# Patient Record
Sex: Female | Born: 1995 | Race: Black or African American | Hispanic: No | Marital: Single | Smoking: Never smoker
Health system: Southern US, Community
[De-identification: ages and names within clinical notes are randomized; demographics above are authoritative.]

---

## 2013-03-07 ENCOUNTER — Emergency Department (HOSPITAL_COMMUNITY)
Admission: EM | Admit: 2013-03-07 | Discharge: 2013-03-07 | Disposition: A | Discharge status: Home / Self Care | Payer: Medicaid Other | Attending: Emergency Medicine | Admitting: Emergency Medicine

## 2013-03-07 ENCOUNTER — Encounter (HOSPITAL_COMMUNITY): Payer: Self-pay | Admitting: Emergency Medicine

## 2013-03-07 DIAGNOSIS — K1370 Unspecified lesions of oral mucosa: Secondary | ICD-10-CM | POA: Insufficient documentation

## 2013-03-07 DIAGNOSIS — Z79899 Other long term (current) drug therapy: Secondary | ICD-10-CM | POA: Insufficient documentation

## 2013-03-07 DIAGNOSIS — K029 Dental caries, unspecified: Secondary | ICD-10-CM | POA: Insufficient documentation

## 2013-03-07 MED ORDER — ACETAMINOPHEN 500 MG PO TABS: 500.0000 mg | ORAL_TABLET | Freq: Four times a day (QID) | ORAL | Status: DC | PRN

## 2013-03-07 NOTE — ED Provider Notes (Signed)
History     CSN: [id number]  Arrival date & time 03/07/13  1154   First MD Initiated Contact with Patient 03/07/13 1306      Chief Complaint  Patient presents with  . Dental Pain    (Consider location/radiation/quality/duration/timing/severity/associated sxs/prior treatment) HPI Pt is a 17yo female c/o dental pain after chewing some gum today.  Pain is sharp and located on right upper gumline, worse when something touches it like food.  Pt states it felt like her tooth was "hanging out" after chewing gum, then she noticed a small amount of blood. Has not tried anything for pain.   Reports she needs to see a dentist for several other dental problems but needs a referral. Denies previous trauma to mouth before today.  Denies fever, n/v/d.  Denies trouble breathing or swallowing.  Denies sore throat.    History reviewed. No pertinent past medical history.  History reviewed. No pertinent past surgical history.  History reviewed. No pertinent family history.  History  Substance Use Topics  . Smoking status: Never Smoker   . Smokeless tobacco: Not on file  . Alcohol Use: No    OB History   Grav Para Term Preterm Abortions TAB SAB Ect Mult Living                  Review of Systems  Constitutional: Negative for fever and chills.  HENT: Positive for dental problem. Negative for sore throat, trouble swallowing, neck stiffness and voice change.   All other systems reviewed and are negative.    Allergies  Review of patient's allergies indicates no known allergies.  Home Medications   Current Outpatient Rx  Name  Route  Sig  Dispense  Refill  . norgestimate-ethinyl estradiol (MONONESSA) 0.25-35 MG-MCG tablet   Oral   Take 1 tablet by mouth daily.         Marland Kitchen acetaminophen (TYLENOL) 500 MG tablet   Oral   Take 1 tablet (500 mg total) by mouth every 6 (six) hours as needed for pain.   30 tablet   0     BP 116/67  Pulse 91  Temp(Src) 98.6 F (37 C) (Oral)  Resp 19   SpO2 100%  LMP 02/28/2013  Physical Exam  Nursing note and vitals reviewed. Constitutional: She appears well-developed and well-nourished. No distress.  HENT:  Head: Normocephalic and atraumatic. No trismus in the jaw.  Mouth/Throat: Uvula is midline, oropharynx is clear and moist and mucous membranes are normal. She does not have dentures. Oral lesions present. Abnormal dentition. Dental caries present. No dental abscesses, edematous or lacerations. No oropharyngeal exudate, posterior oropharyngeal edema, posterior oropharyngeal erythema or tonsillar abscesses.    Oral lesion in upper right gingiva between back two molars.  Tender, purple in color.  No drainage of pus or blood at this time.   Eyes: Conjunctivae are normal. No scleral icterus.  Neck: Normal range of motion. Neck supple. No JVD present. No tracheal deviation present. No thyromegaly present.  Cardiovascular: Normal rate, regular rhythm and normal heart sounds.   Pulmonary/Chest: Effort normal and breath sounds normal. No stridor. No respiratory distress. She has no wheezes. She has no rales. She exhibits no tenderness.  Musculoskeletal: Normal range of motion.  Lymphadenopathy:    She has no cervical adenopathy.  Neurological: She is alert.  Skin: Skin is warm and dry. She is not diaphoretic.  Psychiatric: She has a normal mood and affect. Her behavior is normal.    ED Course  Procedures (including critical care time)  Labs Reviewed - No data to display No results found.   1. Pain due to dental caries   2. Oral lesion       MDM  Pt c/o dental pain after chewing gum today.  States she notices some bleeding associated with the pain. Denies previous trauma to her mouth but know she needs to f/u with a dentist for multiple dental issues.  Denies fever, n/v/d, difficulty swallowing or breathing.  Denies drainage of pus.  Has not had anything for pain.  On exam there is a lesion on upper back right gum line where molar  should be.  Appears part of molar is missing.  Lesion is TTP but no drainage of pus or blood.    Pt also has various other dental caries. Do not suspect abscess or infection at this time.  Not concerned for peritonsillar abscess.  Will refer to Dr. Oswaldo Done, DDS, for further evaluation and treatment.  Rx: ibuprofen.  Advised pt to call within 24-48hrs for appointment.  Advised against eating hard food until evaluated by dentist.   Return to ED if difficulty swallowing or breathing.         Junius Finner, PA-C 03/07/13 [phone]

## 2013-03-07 NOTE — ED Notes (Signed)
States that she was in school when her right upper molar began to bleed. Tooth appears to be damaged. States that she has not seen a dentist in > 3 yrs

## 2013-03-07 NOTE — ED Notes (Signed)
Pt states that she was chewing gum and one of her molars is now "hanging out".

## 2013-03-07 NOTE — ED Notes (Signed)
Voiced understanding of instructions given 

## 2013-03-07 NOTE — ED Provider Notes (Signed)
Medical screening examination/treatment/procedure(s) were performed by non-physician practitioner and as supervising physician I was immediately available for consultation/collaboration.    Jamone Garrido R Vannessa Godown, MD 03/07/13 1606 

## 2013-03-22 ENCOUNTER — Telehealth (HOSPITAL_COMMUNITY): Payer: Self-pay | Admitting: Emergency Medicine

## 2013-05-29 ENCOUNTER — Emergency Department (HOSPITAL_COMMUNITY)
Admission: EM | Admit: 2013-05-29 | Discharge: 2013-05-29 | Disposition: A | Discharge status: Home / Self Care | Payer: Medicaid Other | Attending: Emergency Medicine | Admitting: Emergency Medicine

## 2013-05-29 ENCOUNTER — Encounter (HOSPITAL_COMMUNITY): Payer: Self-pay | Admitting: Emergency Medicine

## 2013-05-29 DIAGNOSIS — N760 Acute vaginitis: Secondary | ICD-10-CM | POA: Insufficient documentation

## 2013-05-29 DIAGNOSIS — N898 Other specified noninflammatory disorders of vagina: Secondary | ICD-10-CM | POA: Insufficient documentation

## 2013-05-29 DIAGNOSIS — B379 Candidiasis, unspecified: Secondary | ICD-10-CM | POA: Insufficient documentation

## 2013-05-29 DIAGNOSIS — Z3202 Encounter for pregnancy test, result negative: Secondary | ICD-10-CM | POA: Insufficient documentation

## 2013-05-29 DIAGNOSIS — B373 Candidiasis of vulva and vagina: Secondary | ICD-10-CM

## 2013-05-29 LAB — URINALYSIS, ROUTINE W REFLEX MICROSCOPIC
Ketones, ur: NEGATIVE mg/dL
Nitrite: NEGATIVE
Specific Gravity, Urine: 1.025 (ref 1.005–1.030)
Urobilinogen, UA: 1 mg/dL (ref 0.0–1.0)

## 2013-05-29 LAB — URINE MICROSCOPIC-ADD ON

## 2013-05-29 LAB — WET PREP, GENITAL
Clue Cells Wet Prep HPF POC: NONE SEEN
Trich, Wet Prep: NONE SEEN

## 2013-05-29 MED ORDER — FLUCONAZOLE 150 MG PO TABS: 150.0000 mg | ORAL_TABLET | Freq: Once | ORAL | Status: DC

## 2013-05-29 MED ORDER — FLUCONAZOLE 100 MG PO TABS
150.0000 mg | ORAL_TABLET | Freq: Once | ORAL | Status: AC
  Administered 2013-05-29: 150 mg via ORAL
  Filled 2013-05-29: qty 2

## 2013-05-29 NOTE — ED Notes (Signed)
Pt c/o vaginal itching and burning x several days; pt sts LMP was last week; pt sts hx of yeast infection and feels same

## 2013-05-29 NOTE — ED Provider Notes (Signed)
History    CSN: [id number] Arrival date & time 05/29/13  1238  First MD Initiated Contact with Patient 05/29/13 1246     Chief Complaint  Patient presents with  . Vaginal Itching   (Consider location/radiation/quality/duration/timing/severity/associated sxs/prior Treatment) HPI Comments: Pt w/ no PMHx now w/ vaginal irritation. Took abx for tooth abscess, noted several days of vaginal irritation and itching - exacerbated w/ wiping and showering. A/w vaginal discharge. LMP several days ago, + sex w/out protection - uses BCP. Denies hematuria/polyuria/dysuria. No fever, abd pain or emesis. No vaginal bleeding.   Patient is a 17 y.o. female presenting with general illness. The history is provided by the patient. No language interpreter was used.  Illness Location:  GU Quality:  Vaginal irritation, itching Severity:  Mild Onset quality:  Gradual Timing:  Constant Progression:  Worsening Chronicity:  New Associated symptoms: no abdominal pain, no chest pain, no congestion, no cough, no diarrhea, no fever, no headaches, no nausea, no rash, no shortness of breath, no sore throat and no vomiting    History reviewed. No pertinent past medical history. History reviewed. No pertinent past surgical history. History reviewed. No pertinent family history. History  Substance Use Topics  . Smoking status: Never Smoker   . Smokeless tobacco: Not on file  . Alcohol Use: No   OB History   Grav Para Term Preterm Abortions TAB SAB Ect Mult Living                 Review of Systems  Constitutional: Negative for fever and chills.  HENT: Negative for congestion and sore throat.   Respiratory: Negative for cough and shortness of breath.   Cardiovascular: Negative for chest pain and leg swelling.  Gastrointestinal: Negative for nausea, vomiting, abdominal pain, diarrhea and constipation.  Genitourinary: Positive for vaginal discharge and vaginal pain. Negative for dysuria and frequency.  Skin:  Negative for color change and rash.  Neurological: Negative for dizziness and headaches.  Psychiatric/Behavioral: Negative for confusion and agitation.  All other systems reviewed and are negative.    Allergies  Review of patient's allergies indicates no known allergies.  Home Medications   Current Outpatient Rx  Name  Route  Sig  Dispense  Refill  . acetaminophen (TYLENOL) 500 MG tablet   Oral   Take 1 tablet (500 mg total) by mouth every 6 (six) hours as needed for pain.   30 tablet   0   . norgestimate-ethinyl estradiol (MONONESSA) 0.25-35 MG-MCG tablet   Oral   Take 1 tablet by mouth daily.          BP 129/78  Pulse 98  Temp(Src) 98 F (36.7 C) (Oral)  Resp 16  SpO2 100% Physical Exam  Vitals reviewed. Constitutional: She is oriented to person, place, and time. She appears well-developed and well-nourished. No distress.  HENT:  Head: Normocephalic and atraumatic.  Eyes: EOM are normal. Pupils are equal, round, and reactive to light.  Neck: Normal range of motion. Neck supple.  Cardiovascular: Normal rate and regular rhythm.   Pulmonary/Chest: Effort normal. No respiratory distress.  Abdominal: Soft. She exhibits no distension. There is no tenderness.  Genitourinary: Vagina normal. Uterus is not tender. Cervix exhibits discharge (white). Cervix exhibits no motion tenderness and no friability. Right adnexum displays no mass and no tenderness. Left adnexum displays no mass and no tenderness.  Musculoskeletal: Normal range of motion. She exhibits no edema.  Neurological: She is alert and oriented to person, place, and time.  Skin:  Skin is warm and dry.  Psychiatric: She has a normal mood and affect. Her behavior is normal.    ED Course  Procedures (including critical care time) Labs Reviewed  WET PREP, GENITAL  GC/CHLAMYDIA PROBE AMP  URINALYSIS, ROUTINE W REFLEX MICROSCOPIC   Results for orders placed during the hospital encounter of 05/29/13  WET PREP,  GENITAL      Result Value Range   Yeast Wet Prep HPF POC MODERATE (*) NONE SEEN   Trich, Wet Prep NONE SEEN  NONE SEEN   Clue Cells Wet Prep HPF POC NONE SEEN  NONE SEEN   WBC, Wet Prep HPF POC FEW (*) NONE SEEN  URINALYSIS, ROUTINE W REFLEX MICROSCOPIC      Result Value Range   Color, Urine YELLOW  YELLOW   APPearance CLEAR  CLEAR   Specific Gravity, Urine 1.025  1.005 - 1.030   pH 5.5  5.0 - 8.0   Glucose, UA NEGATIVE  NEGATIVE mg/dL   Hgb urine dipstick NEGATIVE  NEGATIVE   Bilirubin Urine SMALL (*) NEGATIVE   Ketones, ur NEGATIVE  NEGATIVE mg/dL   Protein, ur NEGATIVE  NEGATIVE mg/dL   Urobilinogen, UA 1.0  0.0 - 1.0 mg/dL   Nitrite NEGATIVE  NEGATIVE   Leukocytes, UA SMALL (*) NEGATIVE  URINE MICROSCOPIC-ADD ON      Result Value Range   Squamous Epithelial / LPF FEW (*) RARE   WBC, UA 0-2  <3 WBC/hpf   RBC / HPF 0-2  <3 RBC/hpf   Bacteria, UA RARE  RARE   Urine-Other MUCOUS PRESENT    POCT PREGNANCY, URINE      Result Value Range   Preg Test, Ur NEGATIVE  NEGATIVE    No results found. No diagnosis found.  MDM  Exam as above, nad, no CMT, copious white discharge - wet prep + for yeast. U/a and hcg negative. At this time doubt PID/cervicitis. Doubt TOA. Given diflucan in ED. Stable for d/c home. Given return precautions. D/c in good condition.  I have personally reviewed labs and considered in my MDM. Case d/w Dr Hyacinth Meeker  1. Yeast infection of the vagina    Discharge Medication List as of 05/29/2013  3:04 PM    START taking these medications   Details  fluconazole (DIFLUCAN) 150 MG tablet Take 1 tablet (150 mg total) by mouth once., Starting 06/05/2013, Print       [facility]     [address] [place] [address]   As needed if symptoms worsen    Audelia Hives, MD 05/29/13 [phone]

## 2013-05-30 LAB — GC/CHLAMYDIA PROBE AMP
CT Probe RNA: NEGATIVE
GC Probe RNA: NEGATIVE

## 2013-05-30 NOTE — ED Provider Notes (Signed)
I saw and evaluated the patient, reviewed the resident's note and I agree with the findings and plan.   Vida Roller, MD 05/30/13 [phone]

## 2013-08-08 ENCOUNTER — Encounter (HOSPITAL_COMMUNITY): Payer: Self-pay | Admitting: Emergency Medicine

## 2013-08-08 ENCOUNTER — Emergency Department (HOSPITAL_COMMUNITY)
Admission: EM | Admit: 2013-08-08 | Discharge: 2013-08-08 | Disposition: A | Discharge status: Home / Self Care | Payer: Medicaid Other | Attending: Emergency Medicine | Admitting: Emergency Medicine

## 2013-08-08 DIAGNOSIS — Z79899 Other long term (current) drug therapy: Secondary | ICD-10-CM | POA: Insufficient documentation

## 2013-08-08 DIAGNOSIS — Z3202 Encounter for pregnancy test, result negative: Secondary | ICD-10-CM | POA: Insufficient documentation

## 2013-08-08 DIAGNOSIS — N390 Urinary tract infection, site not specified: Secondary | ICD-10-CM | POA: Insufficient documentation

## 2013-08-08 DIAGNOSIS — Z792 Long term (current) use of antibiotics: Secondary | ICD-10-CM | POA: Insufficient documentation

## 2013-08-08 LAB — URINALYSIS, ROUTINE W REFLEX MICROSCOPIC
Glucose, UA: NEGATIVE mg/dL
Nitrite: NEGATIVE
Specific Gravity, Urine: 1.029 (ref 1.005–1.030)
pH: 6 (ref 5.0–8.0)

## 2013-08-08 LAB — URINE MICROSCOPIC-ADD ON

## 2013-08-08 LAB — POCT PREGNANCY, URINE: Preg Test, Ur: NEGATIVE

## 2013-08-08 MED ORDER — PHENAZOPYRIDINE HCL 200 MG PO TABS: 200.0000 mg | ORAL_TABLET | Freq: Three times a day (TID) | ORAL | Status: DC

## 2013-08-08 MED ORDER — CEPHALEXIN 500 MG PO CAPS: 500.0000 mg | ORAL_CAPSULE | Freq: Three times a day (TID) | ORAL | Status: DC

## 2013-08-08 NOTE — ED Provider Notes (Signed)
CSN: [id number]     Arrival date & time 08/08/13  0941 History   First MD Initiated Contact with Patient 08/08/13 1112     Chief Complaint  Patient presents with  . Urinary Tract Infection   (Consider location/radiation/quality/duration/timing/severity/associated sxs/prior Treatment) HPI 17 year old female presents emergency department with chief complaint of urinary tract infection.  Patient has had 2 weeks of intermittent dysuria, hematuria and frequency of urination.  She's never had a urinary tract infection before however her mother told her that her symptoms seemed consistent with this diagnosis.  The patient did take antibiotics for dental work that she had done and her symptoms seemed to resolve however this morning she awoke with recurrent urinary discomfort and decided to come for evaluation.  Patient denies fevers, chills, myalgias for flank pain or suprapubic pain.  Patient denies any vaginal symptoms.  History reviewed. No pertinent past medical history. History reviewed. No pertinent past surgical history. No family history on file. History  Substance Use Topics  . Smoking status: Never Smoker   . Smokeless tobacco: Not on file  . Alcohol Use: No   OB History   Grav Para Term Preterm Abortions TAB SAB Ect Mult Living                 Review of Systems Ten systems reviewed and are negative for acute change, except as noted in the HPI.   Allergies  Review of patient's allergies indicates no known allergies.  Home Medications   Current Outpatient Rx  Name  Route  Sig  Dispense  Refill  . amoxicillin (AMOXIL) 500 MG tablet   Oral   Take 500 mg by mouth 2 (two) times daily.         Marland Kitchen ibuprofen (ADVIL,MOTRIN) 200 MG tablet   Oral   Take 200 mg by mouth every 6 (six) hours as needed for pain or headache.         . norgestimate-ethinyl estradiol (MONONESSA) 0.25-35 MG-MCG tablet   Oral   Take 1 tablet by mouth daily.          BP 119/61  Pulse 78  Temp(Src)  98.2 F (36.8 C)  Resp 16  SpO2 98% Physical Exam Physical Exam  Nursing note and vitals reviewed. Constitutional: She is oriented to person, place, and time. She appears well-developed and well-nourished. No distress.  HENT:  Head: Normocephalic and atraumatic.  Eyes: Conjunctivae normal and EOM are normal. Pupils are equal, round, and reactive to light. No scleral icterus.  Neck: Normal range of motion.  Cardiovascular: Normal rate, regular rhythm and normal heart sounds.  Exam reveals no gallop and no friction rub.   No murmur heard. Pulmonary/Chest: Effort normal and breath sounds normal. No respiratory distress.  Abdominal: Soft. Bowel sounds are normal. She exhibits no distension and no mass. There is no tenderness. There is no guarding.  Neurological: She is alert and oriented to person, place, and time.  Skin: Skin is warm and dry. She is not diaphoretic.    ED Course  Procedures (including critical care time) Labs Review Labs Reviewed  URINALYSIS, ROUTINE W REFLEX MICROSCOPIC - Abnormal; Notable for the following:    APPearance CLOUDY (*)    Urobilinogen, UA 2.0 (*)    Leukocytes, UA MODERATE (*)    All other components within normal limits  URINE MICROSCOPIC-ADD ON  POCT PREGNANCY, URINE   Imaging Review No results found.  MDM   1. UTI (lower urinary tract infection)    Pt  has been diagnosed with a UTI. Pt is afebrile, no CVA tenderness, normotensive, and denies N/V. Pt to be dc home with antibiotics and instructions to follow up with PCP if symptoms persist.     Arthor Captain, PA-C 08/08/13 1226

## 2013-08-08 NOTE — ED Notes (Signed)
Has had s/s of UTI x 1 week  Took some old antiobiotics  And it went away now back hurts to void denies vag d/c

## 2013-08-08 NOTE — ED Notes (Signed)
Pt discharged home with all belongings, pt alert and ambulatory upon discharge, pt received 2 new RX verbalizes understanding of d/c instructions.

## 2013-08-08 NOTE — ED Notes (Signed)
Pt reports burning with urination, vaginal odor, and blood in her urine x1 week, took some old antibiotics which she stopped when she realized it was the wrong type of antibiotic, pt reports her symptoms started back today; burning with urination and a mild vaginal odor. Pt denies blood in her urine or vaginal discharge.

## 2013-08-12 NOTE — ED Provider Notes (Signed)
Medical screening examination/treatment/procedure(s) were performed by non-physician practitioner and as supervising physician I was immediately available for consultation/collaboration.  Raeford Razor, MD 08/12/13 1209

## 2014-05-01 ENCOUNTER — Emergency Department (HOSPITAL_COMMUNITY)
Admission: EM | Admit: 2014-05-01 | Discharge: 2014-05-01 | Disposition: A | Discharge status: Home / Self Care | Payer: Medicaid Other | Attending: Emergency Medicine | Admitting: Emergency Medicine

## 2014-05-01 ENCOUNTER — Encounter (HOSPITAL_COMMUNITY): Payer: Self-pay | Admitting: Emergency Medicine

## 2014-05-01 DIAGNOSIS — Z3202 Encounter for pregnancy test, result negative: Secondary | ICD-10-CM | POA: Insufficient documentation

## 2014-05-01 DIAGNOSIS — R319 Hematuria, unspecified: Secondary | ICD-10-CM

## 2014-05-01 DIAGNOSIS — Z79899 Other long term (current) drug therapy: Secondary | ICD-10-CM | POA: Insufficient documentation

## 2014-05-01 DIAGNOSIS — IMO0002 Reserved for concepts with insufficient information to code with codable children: Secondary | ICD-10-CM | POA: Insufficient documentation

## 2014-05-01 DIAGNOSIS — Z792 Long term (current) use of antibiotics: Secondary | ICD-10-CM | POA: Insufficient documentation

## 2014-05-01 DIAGNOSIS — N390 Urinary tract infection, site not specified: Secondary | ICD-10-CM

## 2014-05-01 LAB — URINALYSIS, ROUTINE W REFLEX MICROSCOPIC
Bilirubin Urine: NEGATIVE
Glucose, UA: NEGATIVE mg/dL
KETONES UR: NEGATIVE mg/dL
Nitrite: NEGATIVE
PROTEIN: NEGATIVE mg/dL
Specific Gravity, Urine: 1.021 (ref 1.005–1.030)
UROBILINOGEN UA: 1 mg/dL (ref 0.0–1.0)
pH: 7.5 (ref 5.0–8.0)

## 2014-05-01 LAB — POC URINE PREG, ED: Preg Test, Ur: NEGATIVE

## 2014-05-01 LAB — URINE MICROSCOPIC-ADD ON

## 2014-05-01 MED ORDER — CEPHALEXIN 500 MG PO CAPS: 500.0000 mg | ORAL_CAPSULE | Freq: Three times a day (TID) | ORAL | Status: DC

## 2014-05-01 MED ORDER — SULFAMETHOXAZOLE-TRIMETHOPRIM 800-160 MG PO TABS: 1.0000 | ORAL_TABLET | Freq: Two times a day (BID) | ORAL | Status: DC

## 2014-05-01 NOTE — ED Notes (Signed)
Declined W/C at D/C and was escorted to lobby by RN. 

## 2014-05-01 NOTE — ED Notes (Addendum)
Pt from home with reports of frequent urination x1 week, pt also reports a odor whenever she uses the restroom. Denies any vaginal discharge, fever, n/v/d. Pt states hx of same symptoms and was given antibiotics that helped. nad noted. Axo x4.

## 2014-05-01 NOTE — Discharge Instructions (Signed)
Please call your doctor for a followup appointment within 24-48 hours. When you talk to your doctor please let them know that you were seen in the emergency department and have them acquire all of your records so that they can discuss the findings with you and formulate a treatment plan to fully care for your new and ongoing problems. Please call and set-up an appointment with your primary care provider to be seen and assessed Please rest and stay hydrated - please drink plenty of water Please take medications as prescribed and on a full stomach  Please continue to monitor symptoms and if symptoms are to worsen or change (fever greater than 101, chills, sweating, nausea, vomiting, chest pain, shortness of breath, difficulty breathing, stomach pain, blood in the stools, black tarry stools) please report back to the ED immediately  Urinary Tract Infection Urinary tract infections (UTIs) can develop anywhere along your urinary tract. Your urinary tract is your body's drainage system for removing wastes and extra water. Your urinary tract includes two kidneys, two ureters, a bladder, and a urethra. Your kidneys are a pair of bean-shaped organs. Each kidney is about the size of your fist. They are located below your ribs, one on each side of your spine. CAUSES Infections are caused by microbes, which are microscopic organisms, including fungi, viruses, and bacteria. These organisms are so small that they can only be seen through a microscope. Bacteria are the microbes that most commonly cause UTIs. SYMPTOMS  Symptoms of UTIs may vary by age and gender of the patient and by the location of the infection. Symptoms in young women typically include a frequent and intense urge to urinate and a painful, burning feeling in the bladder or urethra during urination. Older women and men are more likely to be tired, shaky, and weak and have muscle aches and abdominal pain. A fever may mean the infection is in your  kidneys. Other symptoms of a kidney infection include pain in your back or sides below the ribs, nausea, and vomiting. DIAGNOSIS To diagnose a UTI, your caregiver will ask you about your symptoms. Your caregiver also will ask to provide a urine sample. The urine sample will be tested for bacteria and white blood cells. White blood cells are made by your body to help fight infection. TREATMENT  Typically, UTIs can be treated with medication. Because most UTIs are caused by a bacterial infection, they usually can be treated with the use of antibiotics. The choice of antibiotic and length of treatment depend on your symptoms and the type of bacteria causing your infection. HOME CARE INSTRUCTIONS  If you were prescribed antibiotics, take them exactly as your caregiver instructs you. Finish the medication even if you feel better after you have only taken some of the medication.  Drink enough water and fluids to keep your urine clear or pale yellow.  Avoid caffeine, tea, and carbonated beverages. They tend to irritate your bladder.  Empty your bladder often. Avoid holding urine for long periods of time.  Empty your bladder before and after sexual intercourse.  After a bowel movement, women should cleanse from front to back. Use each tissue only once. SEEK MEDICAL CARE IF:   You have back pain.  You develop a fever.  Your symptoms do not begin to resolve within 3 days. SEEK IMMEDIATE MEDICAL CARE IF:   You have severe back pain or lower abdominal pain.  You develop chills.  You have nausea or vomiting.  You have continued burning  or discomfort with urination. MAKE SURE YOU:   Understand these instructions.  Will watch your condition.  Will get help right away if you are not doing well or get worse. Document Released: 08/04/2005 Document Revised: 04/25/2012 Document Reviewed: 12/03/2011 [facility] Patient Information 2015 [place], [place]. This information is not intended to replace  advice given to you by your health care provider. Make sure you discuss any questions you have with your health care provider.   Emergency Department Resource Guide 1) Find a Doctor and Pay Out of Pocket Although you won't have to find out who is covered by your insurance plan, it is a good idea to ask around and get recommendations. You will then need to call the office and see if the doctor you have chosen will accept you as a new patient and what types of options they offer for patients who are self-pay. Some doctors offer discounts or will set up payment plans for their patients who do not have insurance, but you will need to ask so you aren't surprised when you get to your appointment.  2) Contact Your Local Health Department Not all health departments have doctors that can see patients for sick visits, but many do, so it is worth a call to see if yours does. If you don't know where your local health department is, you can check in your phone book. The CDC also has a tool to help you locate your state's health department, and many state websites also have listings of all of their local health departments.  3) Find a Walk-in Clinic If your illness is not likely to be very severe or complicated, you may want to try a walk in clinic. These are popping up all over the country in pharmacies, drugstores, and shopping centers. They're usually staffed by nurse practitioners or physician assistants that have been trained to treat common illnesses and complaints. They're usually fairly quick and inexpensive. However, if you have serious medical issues or chronic medical problems, these are probably not your best option.  No Primary Care Doctor: - Call Health Connect at  [phone] - they can help you locate a primary care doctor that  accepts your insurance, provides certain services, etc. - Physician Referral Service- [phone]  Chronic Pain Problems: Organization         Address  Phone    Notes  [facility]  [phone] Patients need to be referred by their primary care doctor.   Medication Assistance: Organization         Address  Phone   Notes  [facility] Medication [facility] [address] [place], [place] [address] [phone] --Must be a resident of [facility] -- Must have NO insurance coverage whatsoever (no Medicaid/ Medicare, etc.) -- The pt. MUST have a primary care doctor that directs their care regularly and follows them in the community   [facility]  [phone]   [employer]  [phone]    Agencies that provide inexpensive medical care: Organization         Address  Phone   Notes  [facility]  [phone]   [facility]    [phone]   [facility] [address] [place], [place] [address] [phone]   [facility] [address], [place] [phone]   Planned Parenthood    [phone]   [facility]    [phone])  [phone]   [facility]  [address], [place] Phone:  [phone](336) [phone], Fax:  [phone](336) [phone] Hours of Operation:  9 am - 6 pm, M-F.  Also accepts Medicaid/Medicare and self-pay.  [facility]  [address], [place] Phone: [phone](336) [phone], Fax: [phone]. Hours of Operation:  8:30 am - 5:30 pm, M-F.  Also accepts Medicaid and self-pay.  [facility] [place] [address], [place] Phone: [phone]   [facility] [address], [place], [place] [phone], Ext. [phone] Mondays & Thursdays: 7-9 AM.  First 15 patients are seen on a first come, first serve basis.    Medicaid-accepting [facility] Providers:  Organization         Address  Phone   Notes  [facility] [address], [place] [phone] Also accepts self-pay patients.  [facility]  [address], [place]  [phone](336) [phone]   [facility] [address], [place] [phone]) [phone]   [facility] [address] [place] [address], [place] [phone]) [phone]   Renaye RakersVeita Bland [address], [place]   [phone] Only accepts [place] Access [place] patients after they have their name applied to their card.   Self-Pay (no insurance) in [facility]:  Organization         Address  Phone   Notes  Sickle Cell Patients, [facility] [address], [place] [phone]   [facility] [address], [place] [phone]) [phone]   [facility] [place]  [address], [place] [phone]   [facility]/Dr. Osei-Bonsu  [address] [place] [address], [place] or [phone], [place] [phone] Phone number for both [place] and [place] locations is the same.  [facility] [address], [place] [phone](336) [phone]   [facility] [place] [address] [place] [address], [place] or [address] [phone]) [phone]   [facility] [address], [place] [phone]) [phone], phone; [phone]) [phone], fax Sees patients 1st and 3rd Saturday of every month.  Must not qualify for public or private insurance (i.e. Medicaid, Medicare, [place] Health Choice, Veterans' Benefits)  Household income should be no more than 200% of the poverty level The clinic cannot treat you if you are pregnant or think you are pregnant  Sexually transmitted diseases are not treated at the clinic.    Dental Care: Organization         Address  Phone  Notes  [facility] [address], [place] [phone] Accepts children up to age 20 who are enrolled in [place] or [place] Health Choice; pregnant women with a Medicaid card; and children who have  applied for Medicaid or [place] Health Choice, but were declined, whose parents can pay a reduced fee at time of service.  [facility] [place]  [address], [place] [phone] Accepts children up to age 20 who are enrolled in [place] or [place] Health Choice; pregnant women with a Medicaid card; and children who have applied for Medicaid or [place] Health Choice, but were declined, whose parents can pay a reduced fee at time of service.  [facility]  Access PROGRAM ° [address], [place] [phone] Patients are seen by appointment only. Walk-ins are not accepted. [facility] will see patients 18 years of age and older. °Monday - Tuesday (8am-5pm) °Most Wednesdays (8:30-5pm) °$30 per visit, cash only  °[facility] ° [address], [place] [phone] Patients are seen by appointment only. Walk-ins are not accepted. [facility] will see patients 18 years of age and older. °One Wednesday Evening (Monthly: Volunteer Based).  $30 per visit, cash only  °[facility]  [phone] for adults; Children under age 4, call Graduate Pediatric Dentistry at [phone]. Children aged 4-14, please call [phone] to request a pediatric application. ° Dental services are provided in all areas of dental care including fillings, crowns and bridges, complete and partial dentures, implants, gum treatment, root canals, and extractions. Preventive care is also provided. Treatment is provided to both adults and children. °Patients are selected via a lottery and there is often a waiting list. °  °[facility] [address], °[place] ° [phone] [email or web address] °  °[facility] [address], [place], [place] [phone], Ext. [phone] Second and Fourth Thursday of each month, opens at 6:30 AM; Clinic ends at 9 AM.  Patients are seen on a first-come first-served basis, and a  limited number are seen during each clinic.  ° °[facility] ° [address], [place], [place] [phone]   Eligibility Requirements °You must have lived in [place], [place], or [place] counties for at least the last three months. °  You cannot be eligible for state or federal sponsored healthcare insurance, including Veterans Administration, Medicaid, or Medicare. °  You generally cannot be eligible for healthcare insurance through your employer.  °  How to apply: °Eligibility screenings are held every Tuesday and Wednesday afternoon from 1:00 pm until 4:00 pm. You do not need an appointment for the interview!  °[facility] [address], [place], [place] [phone]   °[facility]  [phone]   °[facility]  [phone]   °[facility]  336-570-6415   ° °Behavioral Health Resources in the Community: °Intensive Outpatient Programs °Organization         Address  Phone  Notes  °[facility] [address], [place], [place] [phone]   °[facility] [address], [place], [place] [phone]   °[facility] [address], [place], [place] ° 336-882-2125   °[facility] [address],  °[place],  1-800-853-5163 or 336-641-4981   °Substance Abuse Resources °Organization         Address  Phone  Notes  °[facility]  336-882-2125   °[facility]  336-784-9470   °[facility]  336-285-9073   °[facility]  336-845-3988   °Residential & Outpatient Substance Abuse Program  1-800-659-3381   °Psychological Services °Organization         Address  Phone  Notes  °[facility]  336- 832-9600   °[facility]  336- 378-7881   °[facility] [address], [place] 1-800-853-5163 or 336-641-4981   ° °Mobile Crisis Teams °Organization          Address  Phone  Notes  °[facility], Mobile Crisis Care Unit  1-877-626-1772   °Assertive °Psychotherapeutic Services ° [address] [place],   208-238-1719   [facility] [address] [place] [phone]    Self-Help/Support Groups Organization         Address  Phone             Notes  [facility] [place] - variety of support groups  336- I7437963 Call for more information  Narcotics Anonymous (NA), [facility] [address], [employer] [place]  2 meetings at this location   Statistician         Address  Phone  Notes  [facility] [address],    [place]  2-956-213-0865   [facility]  [address], [place] [address], [place], [place] [phone]   [facility] [address] [place], [place] [phone] Admissions: 8am-3pm M-F  [facility] [address],    [place], [place] [phone]   [facility] [address] [place], [place], [place] [phone]   [facility] [address]  [place], [place] [phone]   [facility] Intensive Outpatient [address], [place], [place] [phone]   [facility] ([facility] [address] [place]  [place], [place] 9-323-557-3220 or 803-625-2913   [facility] ([facility]) [address] [place], [place] 628-315-1761 Accepts Medicaid  [facility] [address]  [place] 6-073-710-6269 Substance Abuse/Addiction Treatment   [facility] Organization         Address  Phone  Notes  [facility]  815-408-7458   Angie Fava, PhD [address] [place], [place]   223-653-1939 or [phone]   [facility]   [address] [place], [place] [phone]   [facility] [address], [place], [place] [phone] Insurance/Medicaid/sponsorship  through [facility] and Families [address]                                    [place], [place] [phone])[phone] Therapy/tele-psych/case  [facility] [address], [place] [phone]    Dr. Lolly Mustache  [phone]   [facility]  [facility] The Dept. 1) [address], [place] 2) [address] [place] 3)  [address], [place] [phone]  9866934820   [facility] Child Abuse Hotline 831 041 2971 or 4782608921 (After Hours)

## 2014-05-01 NOTE — ED Provider Notes (Signed)
CSN: [id number]     Arrival date & time 05/01/14  1823 History  This chart was scribed for Candace MuttonMarissa Sciacca, PA-C working with Candace GivensIva L Knapp, MD by Candace Savage, ED Scribe. This patient was seen in room TR11C/TR11C and the patient's care was started at 6:43 PM.    Chief Complaint  Patient presents with  . Dysuria   Patient is a 18 y.o. female presenting with dysuria. The history is provided by the patient. No language interpreter was used.  Dysuria Associated symptoms: no abdominal pain, no nausea, no vaginal discharge and no vomiting    HPI Comments: Candace Savage is a 18 y.o. female who presents to the Emergency Department complaining of dysuria onset 1 week prior. She states she has associated odor. She states dysuria isn't present when she drinks plenty of water. She states he has been drinking a lot of cranberry juice with some relief. She states she has taken penicillin x7 with no relief. She states she has had similar symptoms before that were relieved with antibiotics. She denies vaginal discharge, fever, nausea, vomiting, diarrhea, abdominal pain, back pain, neck pain, vaginal pain, hematuria, melena, hematochezia.   History reviewed. No pertinent past medical history. History reviewed. No pertinent past surgical history. No family history on file. History  Substance Use Topics  . Smoking status: Never Smoker   . Smokeless tobacco: Not on file  . Alcohol Use: No   OB History   Grav Para Term Preterm Abortions TAB SAB Ect Mult Living                 Review of Systems  Gastrointestinal: Negative for nausea, vomiting, abdominal pain, diarrhea and blood in stool.  Genitourinary: Positive for dysuria and frequency. Negative for hematuria, vaginal discharge and vaginal pain.  Musculoskeletal: Negative for back pain and neck pain.    Allergies  Review of patient's allergies indicates no known allergies.  Home Medications   Prior to Admission medications   Medication Sig  Start Date End Date Taking? Authorizing [name]  Acetaminophen (TYLENOL PO) Take 2 tablets by mouth daily as needed (for headache).   Yes Historical Karra Pink, MD  ibuprofen (ADVIL,MOTRIN) 200 MG tablet Take 400 mg by mouth every 6 (six) hours as needed for pain or headache.    Yes Historical Roldan Laforest, MD  norgestimate-ethinyl estradiol (MONONESSA) 0.25-35 MG-MCG tablet Take 1 tablet by mouth daily.   Yes Historical Marriah Sanderlin, MD  OVER THE COUNTER MEDICATION Apply 1 application topically daily as needed (for ingrown hair).   Yes Historical Sharia Averitt, MD  cephALEXin (KEFLEX) 500 MG capsule Take 1 capsule (500 mg total) by mouth 3 (three) times daily. 05/01/14   Candace Sciacca, PA-C  sulfamethoxazole-trimethoprim (BACTRIM DS,SEPTRA DS) 800-160 MG per tablet Take 1 tablet by mouth 2 (two) times daily. 05/01/14 05/08/14  Candace Sciacca, PA-C   Triage Vitals: BP 119/72  Pulse 84  Temp(Src) 98 F (36.7 C) (Oral)  Resp 16  Ht 5\' 2"  (1.575 m)  Wt 21 lb (9.526 kg)  BMI 3.84 kg/m2  SpO2 100%  LMP 04/22/2014  Physical Exam  Nursing note and vitals reviewed. Constitutional: She is oriented to person, place, and time. She appears well-developed and well-nourished. No distress.  HENT:  Head: Normocephalic and atraumatic.  Mouth/Throat: Oropharynx is clear and moist. No oropharyngeal exudate.  Eyes: Conjunctivae and EOM are normal. Pupils are equal, round, and reactive to light. Right eye exhibits no discharge. Left eye exhibits no discharge.  Neck: Normal range of motion. Neck  supple. No tracheal deviation present.  Negative neck stiffness Negative nuchal rigidity Next cervical lymphadenopathy Negative meningeal signs  Cardiovascular: Normal rate, regular rhythm and normal heart sounds.  Exam reveals no friction rub.   No murmur heard. Cap refill less than 3 seconds  Pulmonary/Chest: Effort normal and breath sounds normal. No respiratory distress. She has no wheezes. She has no rales.  Patient is  able to speak in full sentences without difficulty Negative use of accessory muscles Negative stridor  Abdominal: Soft. Bowel sounds are normal. She exhibits no distension. There is no tenderness. There is no rebound and no guarding.  Negative abdominal distention noted Bowel sounds normal active in all 4 quadrants Abdomen soft upon palpation Negative CVA tenderness bilaterally Negative guarding or rigidity Negative peritoneal signs  Musculoskeletal: Normal range of motion.  Full ROM to upper and lower extremities without difficulty noted, negative ataxia noted.  Lymphadenopathy:    She has no cervical adenopathy.  Neurological: She is alert and oriented to person, place, and time. No cranial nerve deficit. She exhibits normal muscle tone. Coordination normal.  Skin: Skin is warm and dry. She is not diaphoretic.  Psychiatric: She has a normal mood and affect. Her behavior is normal.    ED Course  Procedures (including critical care time) DIAGNOSTIC STUDIES: Oxygen Saturation is 100% on RA, normal by my interpretation.    COORDINATION OF CARE:  Results for orders placed during the hospital encounter of 05/01/14  URINALYSIS, ROUTINE W REFLEX MICROSCOPIC      Result Value Ref Range   Color, Urine YELLOW  YELLOW   APPearance CLOUDY (*) CLEAR   Specific Gravity, Urine 1.021  1.005 - 1.030   pH 7.5  5.0 - 8.0   Glucose, UA NEGATIVE  NEGATIVE mg/dL   Hgb urine dipstick TRACE (*) NEGATIVE   Bilirubin Urine NEGATIVE  NEGATIVE   Ketones, ur NEGATIVE  NEGATIVE mg/dL   Protein, ur NEGATIVE  NEGATIVE mg/dL   Urobilinogen, UA 1.0  0.0 - 1.0 mg/dL   Nitrite NEGATIVE  NEGATIVE   Leukocytes, UA LARGE (*) NEGATIVE  URINE MICROSCOPIC-ADD ON      Result Value Ref Range   Squamous Epithelial / LPF MANY (*) RARE   WBC, UA TOO NUMEROUS TO COUNT  <3 WBC/hpf   RBC / HPF 0-2  <3 RBC/hpf   Bacteria, UA MANY (*) RARE  POC URINE PREG, ED      Result Value Ref Range   Preg Test, Ur NEGATIVE   NEGATIVE   Labs Review Labs Reviewed  URINALYSIS, ROUTINE W REFLEX MICROSCOPIC - Abnormal; Notable for the following:    APPearance CLOUDY (*)    Hgb urine dipstick TRACE (*)    Leukocytes, UA LARGE (*)    All other components within normal limits  URINE MICROSCOPIC-ADD ON - Abnormal; Notable for the following:    Squamous Epithelial / LPF MANY (*)    Bacteria, UA MANY (*)    All other components within normal limits  URINE CULTURE  POC URINE PREG, ED    Imaging Review No results found.   EKG Interpretation None      MDM   Final diagnoses:  Urinary tract infection with hematuria, site unspecified   Filed Vitals:   05/01/14 1837  BP: 119/72  Pulse: 84  Temp: 98 F (36.7 C)  TempSrc: Oral  Resp: 16  Height: 5\' 2"  (1.575 m)  Weight: 21 lb (9.526 kg)  SpO2: 100%    I personally performed the  services described in this documentation, which was scribed in my presence. The recorded information has been reviewed and is accurate.  Urine pregnancy negative. Urinalysis noted trace of hemoglobin with large leukocytes with white blood cells ranging too numerous to count, many squamous cells noted. Urine culture pending. Doubt pyelonephritis. Patient presenting to the ED with UTI. Antibiotics administered in ED setting. Patient stable, afebrile. Patient not septic appearing. Discharged patient with antibiotics. Referred to primary care Tilley Faeth. Discussed with patient to rest and stay hydrated. Discussed with patient to closely monitor symptoms and if symptoms are to worsen or change to report back to the ED - strict return instructions given.  Patient agreed to plan of care, understood, all questions answered.   Candace Mutton, PA-C 05/02/14 [phone]

## 2014-05-01 NOTE — ED Notes (Addendum)
PA at bedside.

## 2014-05-03 NOTE — ED Provider Notes (Signed)
Medical screening examination/treatment/procedure(s) were performed by non-physician practitioner and as supervising physician I was immediately available for consultation/collaboration.   EKG Interpretation None      Devoria AlbeIva Travonta Gill, MD, Armando GangFACEP   Ward GivensIva L Naftula Donahue, MD 05/03/14 0700

## 2014-05-04 LAB — URINE CULTURE
Colony Count: >=100000
SPECIAL REQUESTS: NORMAL

## 2014-05-05 ENCOUNTER — Telehealth (HOSPITAL_BASED_OUTPATIENT_CLINIC_OR_DEPARTMENT_OTHER): Payer: Self-pay

## 2014-05-05 NOTE — Telephone Encounter (Signed)
Post ED Visit - Positive Culture Follow-up  Culture report reviewed by antimicrobial stewardship pharmacist: [x]  Wes Dulaney, Pharm.D., BCPS []  Celedonio MiyamotoJeremy Frens, Pharm.D., BCPS []  Georgina PillionElizabeth Martin, Pharm.D., BCPS []  Barneg[place] LightMinh Pham, [address] BoulevardPharm.D., BCPS, AAHIVP []  Estella HuskMichelle Turner, Pharm.D., BCPS, AAHIVP []  Harvie JuniorNathan Cope, Pharm.D.  Positive Urine culture, >/= 100,000 colonies -> E Coli Treated with Cephalexin and Sulfa Trimeth, organism sensitive to the same and no further patient follow-up is required at this time.  Arvid RightClark, Patricia Dorn 05/05/2014, 11:26 PM

## 2015-04-18 ENCOUNTER — Emergency Department (HOSPITAL_COMMUNITY)
Admission: EM | Admit: 2015-04-18 | Discharge: 2015-04-18 | Disposition: A | Discharge status: Home / Self Care | Payer: Medicaid Other | Attending: Emergency Medicine | Admitting: Emergency Medicine

## 2015-04-18 ENCOUNTER — Encounter (HOSPITAL_COMMUNITY): Payer: Self-pay | Admitting: *Deleted

## 2015-04-18 DIAGNOSIS — Z3202 Encounter for pregnancy test, result negative: Secondary | ICD-10-CM | POA: Insufficient documentation

## 2015-04-18 DIAGNOSIS — Z793 Long term (current) use of hormonal contraceptives: Secondary | ICD-10-CM | POA: Insufficient documentation

## 2015-04-18 DIAGNOSIS — N390 Urinary tract infection, site not specified: Secondary | ICD-10-CM | POA: Insufficient documentation

## 2015-04-18 LAB — URINALYSIS, ROUTINE W REFLEX MICROSCOPIC
BILIRUBIN URINE: NEGATIVE
Glucose, UA: NEGATIVE mg/dL
Ketones, ur: NEGATIVE mg/dL
Nitrite: NEGATIVE
PH: 5.5 (ref 5.0–8.0)
PROTEIN: 100 mg/dL — AB
SPECIFIC GRAVITY, URINE: 1.024 (ref 1.005–1.030)
Urobilinogen, UA: 1 mg/dL (ref 0.0–1.0)

## 2015-04-18 LAB — POC URINE PREG, ED: PREG TEST UR: NEGATIVE

## 2015-04-18 LAB — URINE MICROSCOPIC-ADD ON

## 2015-04-18 MED ORDER — CEPHALEXIN 500 MG PO CAPS: 500.0000 mg | ORAL_CAPSULE | Freq: Four times a day (QID) | ORAL | Status: AC

## 2015-04-18 NOTE — Discharge Instructions (Signed)
Urinary Tract Infection Urinary tract infections (UTIs) can develop anywhere along your urinary tract. Your urinary tract is your body's drainage system for removing wastes and extra water. Your urinary tract includes two kidneys, two ureters, a bladder, and a urethra. Your kidneys are a pair of bean-shaped organs. Each kidney is about the size of your fist. They are located below your ribs, one on each side of your spine. CAUSES Infections are caused by microbes, which are microscopic organisms, including fungi, viruses, and bacteria. These organisms are so small that they can only be seen through a microscope. Bacteria are the microbes that most commonly cause UTIs. SYMPTOMS  Symptoms of UTIs may vary by age and gender of the patient and by the location of the infection. Symptoms in young women typically include a frequent and intense urge to urinate and a painful, burning feeling in the bladder or urethra during urination. Older women and men are more likely to be tired, shaky, and weak and have muscle aches and abdominal pain. A fever may mean the infection is in your kidneys. Other symptoms of a kidney infection include pain in your back or sides below the ribs, nausea, and vomiting. DIAGNOSIS To diagnose a UTI, your caregiver will ask you about your symptoms. Your caregiver also will ask to provide a urine sample. The urine sample will be tested for bacteria and white blood cells. White blood cells are made by your body to help fight infection. TREATMENT  Typically, UTIs can be treated with medication. Because most UTIs are caused by a bacterial infection, they usually can be treated with the use of antibiotics. The choice of antibiotic and length of treatment depend on your symptoms and the type of bacteria causing your infection. HOME CARE INSTRUCTIONS  If you were prescribed antibiotics, take them exactly as your caregiver instructs you. Finish the medication even if you feel better after you  have only taken some of the medication.  Drink enough water and fluids to keep your urine clear or pale yellow.  Avoid caffeine, tea, and carbonated beverages. They tend to irritate your bladder.  Empty your bladder often. Avoid holding urine for long periods of time.  Empty your bladder before and after sexual intercourse.  After a bowel movement, women should cleanse from front to back. Use each tissue only once. SEEK MEDICAL CARE IF:   You have back pain.  You develop a fever.  Your symptoms do not begin to resolve within 3 days. SEEK IMMEDIATE MEDICAL CARE IF:   You have severe back pain or lower abdominal pain.  You develop chills.  You have nausea or vomiting.  You have continued burning or discomfort with urination. MAKE SURE YOU:   Understand these instructions.  Will watch your condition.  Will get help right away if you are not doing well or get worse. Document Released: 08/04/2005 Document Revised: 04/25/2012 Document Reviewed: 12/03/2011 [facility] Patient Information 2015 [place]. This information is not intended to replace advice given to you by your health care provider. Make sure you discuss any questions you have with your health care provider.  Antibiotic Medication Antibiotic medicine helps fight germs. Germs cause infections. This type of medicine will not work for colds, flu, or other viral infections. Tell your doctor if you:  Are allergic to any medicines.  Are pregnant or are trying to get pregnant.  Are taking other medicines.  Have other medical problems. HOME CARE  Take your medicine with a glass of water or  food as told by your doctor.  Take the medicine as told. Finish them even if you start to feel better.  Do not give your medicine to other people.  Do not use your medicine in the future for a different infection.  Ask your doctor about which side effects to watch for.  Try not to miss any doses. If you miss a dose, take  it as soon as possible. If it is almost time for your next dose, and your dosing schedule is:  Two doses a day, take the missed dose and the next dose 5 to 6 hours later.  Three or more doses a day, take the missed dose and the next dose 2 to 4 hours later, or double your next dose.  Then go back to your normal schedule. GET HELP RIGHT AWAY IF:   You get worse or do not get better within a few days.  The medicine makes you sick.  You develop a rash or any other side effects.  You have questions or concerns. MAKE SURE YOU:  Understand these instructions.  Will watch your condition.  Will get help right away if you are not doing well or get worse. Document Released: 08/03/2008 Document Revised: 01/17/2012 Document Reviewed: 09/30/2009 [facility] Patient Information 2015 [place], [place]. This information is not intended to replace advice given to you by your health care provider. Make sure you discuss any questions you have with your health care provider.

## 2015-04-18 NOTE — ED Notes (Signed)
Pt reports having pain with urination, mild vaginal itching and foul odor to urine. Denies vaginal discharge. Recent history of UTI.

## 2015-04-18 NOTE — ED Provider Notes (Signed)
CSN: [id number]     Arrival date & time 04/18/15  1854 History  This chart was scribed for non-physician practitioner Everlene Farrier PA-C working with Richardean Canal, MD by Lyndel Safe, ED Scribe. This patient was seen in room TR01C/TR01C and the patient's care was started at 8:11 PM.   Chief Complaint  Patient presents with  . Dysuria   The history is provided by the patient. No language interpreter was used.   HPI Comments: Candace Savage is a 19 y.o. female, with a PMhx of UTIs, who presents to the Emergency Department complaining of gradual onset, constant, moderate dysuria that began 3 days ago. She reports associated mild vaginal itching and malodorous urine. Pt notes she noticed the dysuria, frequency, and urgency worsened today. She states a subjective UTI. She notes her last UTI was 05/01/14. Pt has an appt with her PCP on Monday but decided to come to the ED after her symptoms worsened today. Her LNMP was 5/12. Pt denies vaginal discharge/bleeding, hematuria, fevers, chills, nausea, vomiting, diarrhea, constipation, abdominal pain, changes in appetite, or back pain.   History reviewed. No pertinent past medical history. History reviewed. No pertinent past surgical history. History reviewed. No pertinent family history. History  Substance Use Topics  . Smoking status: Never Smoker   . Smokeless tobacco: Not on file  . Alcohol Use: No   OB History    No data available     Review of Systems  Constitutional: Negative for fever, chills and appetite change.  Gastrointestinal: Negative for nausea, vomiting, abdominal pain, diarrhea and constipation.  Endocrine: Positive for polyuria.  Genitourinary: Positive for dysuria, urgency and frequency. Negative for hematuria, vaginal bleeding, vaginal discharge, difficulty urinating and menstrual problem.  Musculoskeletal: Negative for back pain.  Skin: Negative for rash.   Allergies  Review of patient's allergies indicates no known  allergies.  Home Medications   Prior to Admission medications   Medication Sig Start Date End Date Taking? Authorizing Provider  Acetaminophen (TYLENOL PO) Take 2 tablets by mouth daily as needed (for headache).    Historical Provider, MD  cephALEXin (KEFLEX) 500 MG capsule Take 1 capsule (500 mg total) by mouth 4 (four) times daily. 04/18/15   Everlene Farrier, PA-C  ibuprofen (ADVIL,MOTRIN) 200 MG tablet Take 400 mg by mouth every 6 (six) hours as needed for pain or headache.     Historical Provider, MD  norgestimate-ethinyl estradiol (MONONESSA) 0.25-35 MG-MCG tablet Take 1 tablet by mouth daily.    Historical Provider, MD  OVER THE COUNTER MEDICATION Apply 1 application topically daily as needed (for ingrown hair).    Historical Provider, MD   BP 120/73 mmHg  Pulse 93  Temp(Src) 98 F (36.7 C) (Oral)  Resp 18  Ht 5\' 3"  (1.6 m)  Wt 126 lb (57.153 kg)  BMI 22.33 kg/m2  SpO2 98%  LMP 03/25/2015  Physical Exam  Constitutional: She appears well-developed and well-nourished. No distress.  HENT:  Head: Normocephalic and atraumatic.  Eyes: Conjunctivae are normal. Pupils are equal, round, and reactive to light. Right eye exhibits no discharge. Left eye exhibits no discharge.  Neck: Neck supple.  Cardiovascular: Normal rate, regular rhythm, normal heart sounds and intact distal pulses.   Pulmonary/Chest: Effort normal and breath sounds normal. No respiratory distress.  Abdominal: Soft. She exhibits no distension and no mass. There is no tenderness. There is no rebound, no guarding and no CVA tenderness.  No CVA tenderness. Abdomen is soft and nontender to palpation. Bowel sounds are  present. No McBurney's point tenderness. . Negative Rovsing sign. Negative psoas and obturator sign.   Lymphadenopathy:    She has no cervical adenopathy.  Neurological: She is alert. Coordination normal.  Skin: Skin is warm and dry. No rash noted. She is not diaphoretic.  Psychiatric: She has a normal mood  and affect. Her behavior is normal.  Nursing note and vitals reviewed.  ED Course  Procedures  DIAGNOSTIC STUDIES: Oxygen Saturation is 98% on RA, normal by my interpretation.    COORDINATION OF CARE: 8:15 PM Discussed treatment plan which includes to prescribe Keflex with pt. Advised to keep appt on Monday with PCP as a follow up. Pt acknowledges and agrees to plan.   Labs Review Labs Reviewed  URINALYSIS, ROUTINE W REFLEX MICROSCOPIC (NOT AT [facility]) - Abnormal; Notable for the following:    Color, Urine AMBER (*)    APPearance TURBID (*)    Hgb urine dipstick LARGE (*)    Protein, ur 100 (*)    Leukocytes, UA LARGE (*)    All other components within normal limits  URINE MICROSCOPIC-ADD ON - Abnormal; Notable for the following:    Bacteria, UA MANY (*)    All other components within normal limits  URINE CULTURE  POC URINE PREG, ED    Imaging Review No results found.   EKG Interpretation None      Filed Vitals:   04/18/15 1906  BP: 120/73  Pulse: 93  Temp: 98 F (36.7 C)  TempSrc: Oral  Resp: 18  Height:  (1.6 m)  Weight: 126 lb (57.153 kg)  SpO2: 98%     MDM   Final diagnoses:  UTI (lower urinary tract infection)   Pt has been diagnosed with a UTI. Pt is afebrile, no CVA tenderness, normotensive, and denies N/V. Pt to be dc home with Keflex antibiotics and instructions to follow up with PCP. Urine sent for culture. I advised the patient to follow-up with their primary care provider this week. I advised the patient to return to the emergency department with new or worsening symptoms or new concerns. The patient verbalized understanding and agreement with plan.    I personally performed the services described in this documentation, which was scribed in my presence. The recorded information has been reviewed and is accurate.      Everlene Farrier, PA-C 04/18/15 2120  Richardean Canal, MD 04/19/15 [phone]

## 2015-04-21 LAB — URINE CULTURE: Colony Count: >=100000

## 2015-04-22 ENCOUNTER — Telehealth: Payer: Self-pay | Admitting: Emergency Medicine

## 2015-04-22 NOTE — Telephone Encounter (Signed)
Post ED Visit - Positive Culture Follow-up  Culture report reviewed by antimicrobial stewardship pharmacist: []  Wes Dulaney, Pharm.D., BCPS [x]  Celedonio Miyamoto, [address].D., BCPS []  Georgina Pillion, [address].D., BCPS []  [place], [address].D., BCPS, AAHIVP []  Estella Husk, Pharm.D., BCPS, AAHIVP []  Elder Cyphers, [address].D., BCPS  Positive Urine culture Treated with Cephalexin, organism sensitive to the same and no further patient follow-up is required at this time.  Jiles Harold 04/22/2015, 3:06 PM

## 2015-12-26 ENCOUNTER — Emergency Department (HOSPITAL_COMMUNITY): Payer: Self-pay

## 2015-12-26 ENCOUNTER — Encounter (HOSPITAL_COMMUNITY): Payer: Self-pay | Admitting: Emergency Medicine

## 2015-12-26 ENCOUNTER — Emergency Department (HOSPITAL_COMMUNITY)
Admission: EM | Admit: 2015-12-26 | Discharge: 2015-12-26 | Disposition: A | Discharge status: Home / Self Care | Payer: Self-pay | Attending: Emergency Medicine | Admitting: Emergency Medicine

## 2015-12-26 DIAGNOSIS — J189 Pneumonia, unspecified organism: Secondary | ICD-10-CM

## 2015-12-26 DIAGNOSIS — J159 Unspecified bacterial pneumonia: Secondary | ICD-10-CM | POA: Insufficient documentation

## 2015-12-26 DIAGNOSIS — Z793 Long term (current) use of hormonal contraceptives: Secondary | ICD-10-CM | POA: Insufficient documentation

## 2015-12-26 DIAGNOSIS — Z792 Long term (current) use of antibiotics: Secondary | ICD-10-CM | POA: Insufficient documentation

## 2015-12-26 MED ORDER — AZITHROMYCIN 250 MG PO TABS: 250.0000 mg | ORAL_TABLET | Freq: Every day | ORAL | Status: AC

## 2015-12-26 MED ORDER — AZITHROMYCIN 250 MG PO TABS
500.0000 mg | ORAL_TABLET | Freq: Once | ORAL | Status: AC
  Administered 2015-12-26: 500 mg via ORAL
  Filled 2015-12-26: qty 2

## 2015-12-26 NOTE — ED Provider Notes (Signed)
CSN: [id number]     Arrival date & time 12/26/15  1324 History  By signing my name below, I, Elon Spanner, attest that this documentation has been prepared under the direction and in the presence of Flemon Kelty, PA-C. Electronically Signed: Elon Spanner ED Scribe. 12/26/2015. 2:11 PM.    No chief complaint on file.  The history is provided by the patient. No language interpreter was used.   HPI Comments: Candace Savage is a 20 y.o. female who presents to the Emergency Department complaining of an initially productive, currently dry cough and chest congestion onset 1 week ago. The patient reports her symptoms improved but then began to worsen again. She states that her initial illness began 1 week ago. She had a productive, yellow cough, fatigue and subjective fevers. The patient has used Nyquil and Mucinex DM with transient improvement. She reports feeling better earlier in the week and then a worsening, dry cough over the past day. She denies sick contacts. She denies current fevers, chills, headaches, dizziness, syncope, ear pain, eye redness, rhinorrhea, nasal congestion, sore throat, neck pain, chest pain, SOB, abdominal pain, nausea, vomiting or myalgias.   History reviewed. No pertinent past medical history. History reviewed. No pertinent past surgical history. No family history on file. Social History  Substance Use Topics  . Smoking status: Never Smoker   . Smokeless tobacco: None  . Alcohol Use: No   OB History    No data available     Review of Systems  Constitutional: Positive for fever (subjective, resolved) and fatigue.  Respiratory: Positive for cough.   All other systems reviewed and are negative.     Allergies  Review of patient's allergies indicates no known allergies.  Home Medications   Prior to Admission medications   Medication Sig Start Date End Date Taking? Authorizing Provider  Acetaminophen (TYLENOL PO) Take 2 tablets by mouth daily as needed (for  headache).    Historical Provider, MD  azithromycin (ZITHROMAX) 250 MG tablet Take 1 tablet (250 mg total) by mouth daily. Take 1 tablet once a day starting tomorrow (2/18) 12/26/15   Isaak Delmundo, PA-C  cephALEXin (KEFLEX) 500 MG capsule Take 1 capsule (500 mg total) by mouth 4 (four) times daily. 04/18/15   Everlene Farrier, PA-C  ibuprofen (ADVIL,MOTRIN) 200 MG tablet Take 400 mg by mouth every 6 (six) hours as needed for pain or headache.     Historical Provider, MD  norgestimate-ethinyl estradiol (MONONESSA) 0.25-35 MG-MCG tablet Take 1 tablet by mouth daily.    Historical Provider, MD  OVER THE COUNTER MEDICATION Apply 1 application topically daily as needed (for ingrown hair).    Historical Provider, MD   BP 114/68 mmHg  Pulse 92  Temp(Src) 98.1 F (36.7 C) (Oral)  Resp 18  SpO2 100%  LMP 11/24/2015 (Approximate) Physical Exam  Constitutional: She is oriented to person, place, and time. She appears well-developed and well-nourished. No distress.  Nontoxic-appearing  HENT:  Head: Normocephalic and atraumatic.  Right Ear: Tympanic membrane and ear canal normal.  Left Ear: Tympanic membrane and ear canal normal.  Nose: Nose normal. No mucosal edema or rhinorrhea.  Mouth/Throat: Uvula is midline. No uvula swelling. No oropharyngeal exudate, posterior oropharyngeal edema or posterior oropharyngeal erythema.  Eyes: Conjunctivae and EOM are normal.  Neck: Normal range of motion. Neck supple.  Cardiovascular: Normal rate, regular rhythm and normal heart sounds.   Pulmonary/Chest: Effort normal and breath sounds normal. No respiratory distress. She has no wheezes. She has no rales.  Breathing unlabored and in no respiratory distress. Chest expansion equal. Lungs clear to auscultation throughout. Good air movement in all lung fields.  Abdominal: Soft. Bowel sounds are normal. There is no tenderness.  Musculoskeletal: Normal range of motion.  Lymphadenopathy:    She has no cervical  adenopathy.  Neurological: She is alert and oriented to person, place, and time.  Skin: Skin is warm and dry.  Psychiatric: She has a normal mood and affect. Her behavior is normal.  Nursing note and vitals reviewed.   ED Course  Procedures (including critical care time)  DIAGNOSTIC STUDIES: Oxygen Saturation is 100% on RA, normal by my interpretation.    COORDINATION OF CARE:  2:15 PM Will order CXR.  Patient acknowledges and agrees with plan.  ]  Labs Review Labs Reviewed - No data to display  Imaging Review Dg Chest 2 View  12/26/2015  CLINICAL DATA:  20 year old female with cough and congestion for 1 week EXAM: CHEST  2 VIEW COMPARISON:  None. FINDINGS: Subtly increased airspace opacity in the left upper lobe. Mild central airway thickening and peribronchial cuffing. Otherwise, the lungs are clear. No pleural effusion or pneumothorax. No acute osseous abnormality. Cardiothymic and scratch then cardiac mediastinal contours are normal. Unremarkable imaged upper abdominal bowel gas pattern. IMPRESSION: Slightly increased airspace opacity in the left upper lobe concerning for early bronchopneumonia. Electronically Signed   By: Malachy Moan M.D.   On: 12/26/2015 15:03   I have personally reviewed and evaluated these images and lab results as part of my medical decision-making.   EKG Interpretation None      MDM   Final diagnoses:  CAP (community acquired pneumonia)   20 year old female presenting with cough 1 week. Initially had subjective fevers, fatigue and productive cough which improved before worsening yesterday. She only complains of dry cough currently. Afebrile, nontoxic appearing. TMs clear bilaterally. No nasal congestion noted. Oropharynx without erythema or exudate. No respiratory distress. Lungs clear to auscultation bilaterally. Chest x-ray shows increased opacity in left upper lobe concerning for early pneumonia. Given first dose of azithromycin in emergency  department and will discharge home with prescription for remaining course. Patient is to follow up with her PCP if her symptoms do not improve. Return precautions given in discharge paperwork and discussed with pt at bedside. Pt stable for discharge  I personally performed the services described in this documentation, which was scribed in my presence. The recorded information has been reviewed and is accurate.    Alveta Heimlich, PA-C 12/26/15 1540  Mancel Bale, MD 12/27/15 [phone]

## 2015-12-26 NOTE — Discharge Instructions (Signed)

## 2016-10-21 ENCOUNTER — Encounter (HOSPITAL_COMMUNITY): Payer: Self-pay | Admitting: Emergency Medicine

## 2016-10-21 ENCOUNTER — Emergency Department (HOSPITAL_COMMUNITY)
Admission: EM | Admit: 2016-10-21 | Discharge: 2016-10-21 | Disposition: A | Discharge status: Home / Self Care | Payer: Medicaid Other | Attending: Emergency Medicine | Admitting: Emergency Medicine

## 2016-10-21 ENCOUNTER — Emergency Department (HOSPITAL_COMMUNITY): Payer: Medicaid Other

## 2016-10-21 DIAGNOSIS — J069 Acute upper respiratory infection, unspecified: Secondary | ICD-10-CM | POA: Insufficient documentation

## 2016-10-21 DIAGNOSIS — B9789 Other viral agents as the cause of diseases classified elsewhere: Secondary | ICD-10-CM

## 2016-10-21 MED ORDER — OXYMETAZOLINE HCL 0.05 % NA SOLN: 1.0000 | Freq: Two times a day (BID) | NASAL | 0 refills | Status: AC

## 2016-10-21 MED ORDER — BENZONATATE 100 MG PO CAPS: 200.0000 mg | ORAL_CAPSULE | Freq: Two times a day (BID) | ORAL | 0 refills | Status: AC | PRN

## 2016-10-21 NOTE — ED Notes (Signed)
[facility]

## 2016-10-21 NOTE — Discharge Instructions (Signed)
Take your medications as prescribed as needed for cough and nasal congestion. Continue drinking fluids at home to remain hydrated.  Please follow up with a primary care provider from the Resource Guide provided below in 4-5 days if your symptoms have not improved. Please return to the Emergency Department if symptoms worsen or new onset of fever, chills, body aches, difficulty breathing, coughing up blood, chest pain, vomiting, unable to keep fluids down.

## 2016-10-21 NOTE — ED Provider Notes (Signed)
[facility]-EMERGENCY DEPT Provider Note   CSN: [id number] Arrival date & time: 10/21/16  [id number]     History   Chief Complaint Chief Complaint  Patient presents with  . Cough    HPI Candace Savage is a 20 y.o. female.  HPI   Pt is a 20 yo female with no PMH who presents to the ED with complaint of cough, onset yesterday. Pt reports yesterday she began having an intermittent productive cough (yellow sputum) with associated "scratchy throat" and nasal congestion. Pt reports she had similar sxs last year when she was dx with pneumonia but notes her sxs today are not as severe, however she was concerned and wanted to come in to be evaluated. Denies fever, chills, body aches, HA, sinus pressure, rhinorrhea, dysphagia, facial/neck swelling, drooling, trismus, voice change, ear pain, hemoptysis, wheezing, SOB, CP, abdominal pain, N/V, rash. Denies any known sick contacts. Pt reports taking Mucinex yesterday with mild relief.   History reviewed. No pertinent past medical history.  There are no active problems to display for this patient.   History reviewed. No pertinent surgical history.  OB History    No data available       Home Medications    Prior to Admission medications   Medication Sig Start Date End Date Taking? Authorizing Provider  Acetaminophen (TYLENOL PO) Take 2 tablets by mouth daily as needed (for headache).    Historical Provider, MD  azithromycin (ZITHROMAX) 250 MG tablet Take 1 tablet (250 mg total) by mouth daily. Take 1 tablet once a day starting tomorrow (2/18) 12/26/15   Stevi Barrett, PA-C  benzonatate (TESSALON) 100 MG capsule Take 2 capsules (200 mg total) by mouth 2 (two) times daily as needed for cough. 10/21/16   Barrett HenleNicole Elizabeth Zaylia Riolo, PA-C  cephALEXin (KEFLEX) 500 MG capsule Take 1 capsule (500 mg total) by mouth 4 (four) times daily. 04/18/15   Everlene FarrierWilliam Dansie, PA-C  ibuprofen (ADVIL,MOTRIN) 200 MG tablet Take 400 mg by mouth every 6 (six) hours as needed  for pain or headache.     Historical Provider, MD  norgestimate-ethinyl estradiol (MONONESSA) 0.25-35 MG-MCG tablet Take 1 tablet by mouth daily.    Historical Provider, MD  OVER THE COUNTER MEDICATION Apply 1 application topically daily as needed (for ingrown hair).    Historical Provider, MD  oxymetazoline (AFRIN NASAL SPRAY) 0.05 % nasal spray Place 1 spray into both nostrils 2 (two) times daily. Spray once into each nostril twice daily for up to the next 3 days. Do not use for more than 3 days to prevent rebound rhinorrhea. 10/21/16   Barrett HenleNicole Elizabeth Mischele Detter, PA-C    Family History History reviewed. No pertinent family history.  Social History Social History  Substance Use Topics  . Smoking status: Never Smoker  . Smokeless tobacco: Never Used  . Alcohol use No     Allergies   Patient has no known allergies.   Review of Systems Review of Systems  HENT: Positive for congestion and sore throat.   Respiratory: Positive for cough.   All other systems reviewed and are negative.    Physical Exam Updated Vital Signs BP 141/95 (BP Location: Left Arm)   Pulse 87   Temp 97.9 F (36.6 C) (Oral)   Resp 16   SpO2 100%   Physical Exam  Constitutional: She is oriented to person, place, and time. She appears well-developed and well-nourished. No distress.  HENT:  Head: Normocephalic and atraumatic.  Right Ear: Tympanic membrane normal.  Left Ear: Tympanic  membrane normal.  Nose: Nose normal. Right sinus exhibits no maxillary sinus tenderness and no frontal sinus tenderness. Left sinus exhibits no maxillary sinus tenderness and no frontal sinus tenderness.  Mouth/Throat: Uvula is midline and mucous membranes are normal. Posterior oropharyngeal erythema present. No oropharyngeal exudate, posterior oropharyngeal edema or tonsillar abscesses. No tonsillar exudate.  Eyes: Conjunctivae and EOM are normal. Right eye exhibits no discharge. Left eye exhibits no discharge. No scleral  icterus.  Neck: Normal range of motion. Neck supple.  Cardiovascular: Normal rate, regular rhythm, normal heart sounds and intact distal pulses.   Pulmonary/Chest: Effort normal and breath sounds normal. No respiratory distress. She has no wheezes. She has no rales. She exhibits no tenderness.  Abdominal: Soft. Bowel sounds are normal. She exhibits no distension and no mass. There is no tenderness. There is no rebound and no guarding.  Musculoskeletal: Normal range of motion. She exhibits no edema.  Lymphadenopathy:    She has no cervical adenopathy.  Neurological: She is alert and oriented to person, place, and time.  Skin: Skin is warm and dry. No rash noted. She is not diaphoretic.  Nursing note and vitals reviewed.    ED Treatments / Results  Labs (all labs ordered are listed, but only abnormal results are displayed) Labs Reviewed - No data to display  EKG  EKG Interpretation None       Radiology Dg Chest 2 View  Result Date: 10/21/2016 CLINICAL DATA:  Cough and congestion for 3 days. EXAM: CHEST  2 VIEW COMPARISON:  December 26, 2015 FINDINGS: Lungs are clear. Heart size and pulmonary vascularity are normal. No adenopathy. No bone lesions. IMPRESSION: No edema or consolidation. Electronically Signed   By: Bretta BangWilliam  Woodruff III M.D.   On: 10/21/2016 08:01    Procedures Procedures (including critical care time)  Medications Ordered in ED Medications - No data to display   Initial Impression / Assessment and Plan / ED Course  I have reviewed the triage vital signs and the nursing notes.  Pertinent labs & imaging results that were available during my care of the patient were reviewed by me and considered in my medical decision making (see chart for details).  Clinical Course     CXR ordered in triage negative for acute infiltrate. Patients symptoms are consistent with URI, likely viral etiology. Discussed that antibiotics are not indicated for viral infections. Pt  will be discharged with symptomatic treatment.  Verbalizes understanding and is agreeable with plan. Pt is hemodynamically stable & in NAD prior to dc. Discussed return precautions.   Final Clinical Impressions(s) / ED Diagnoses   Final diagnoses:  Viral URI with cough    New Prescriptions New Prescriptions   BENZONATATE (TESSALON) 100 MG CAPSULE    Take 2 capsules (200 mg total) by mouth 2 (two) times daily as needed for cough.   OXYMETAZOLINE (AFRIN NASAL SPRAY) 0.05 % NASAL SPRAY    Place 1 spray into both nostrils 2 (two) times daily. Spray once into each nostril twice daily for up to the next 3 days. Do not use for more than 3 days to prevent rebound rhinorrhea.     Satira Sarkicole Elizabeth [place], [place]PA-C 10/21/16 [id number]    Lyndal Pulleyaniel Knott, MD 10/22/16 1247

## 2016-10-21 NOTE — ED Triage Notes (Signed)
Pt sts cough and congestion x 3 days with hx of PNA; pt unsure if could be same

## 2016-10-21 NOTE — ED Notes (Signed)
Pt waiting on results.

## 2017-10-21 IMAGING — DX DG CHEST 2V
2 series · 2 of 2 positions shown · non-contrast
Comparison: December 26, 2015

CLINICAL DATA: Cough and congestion for 3 days.

EXAM:
CHEST  2 VIEW

[chest pa]
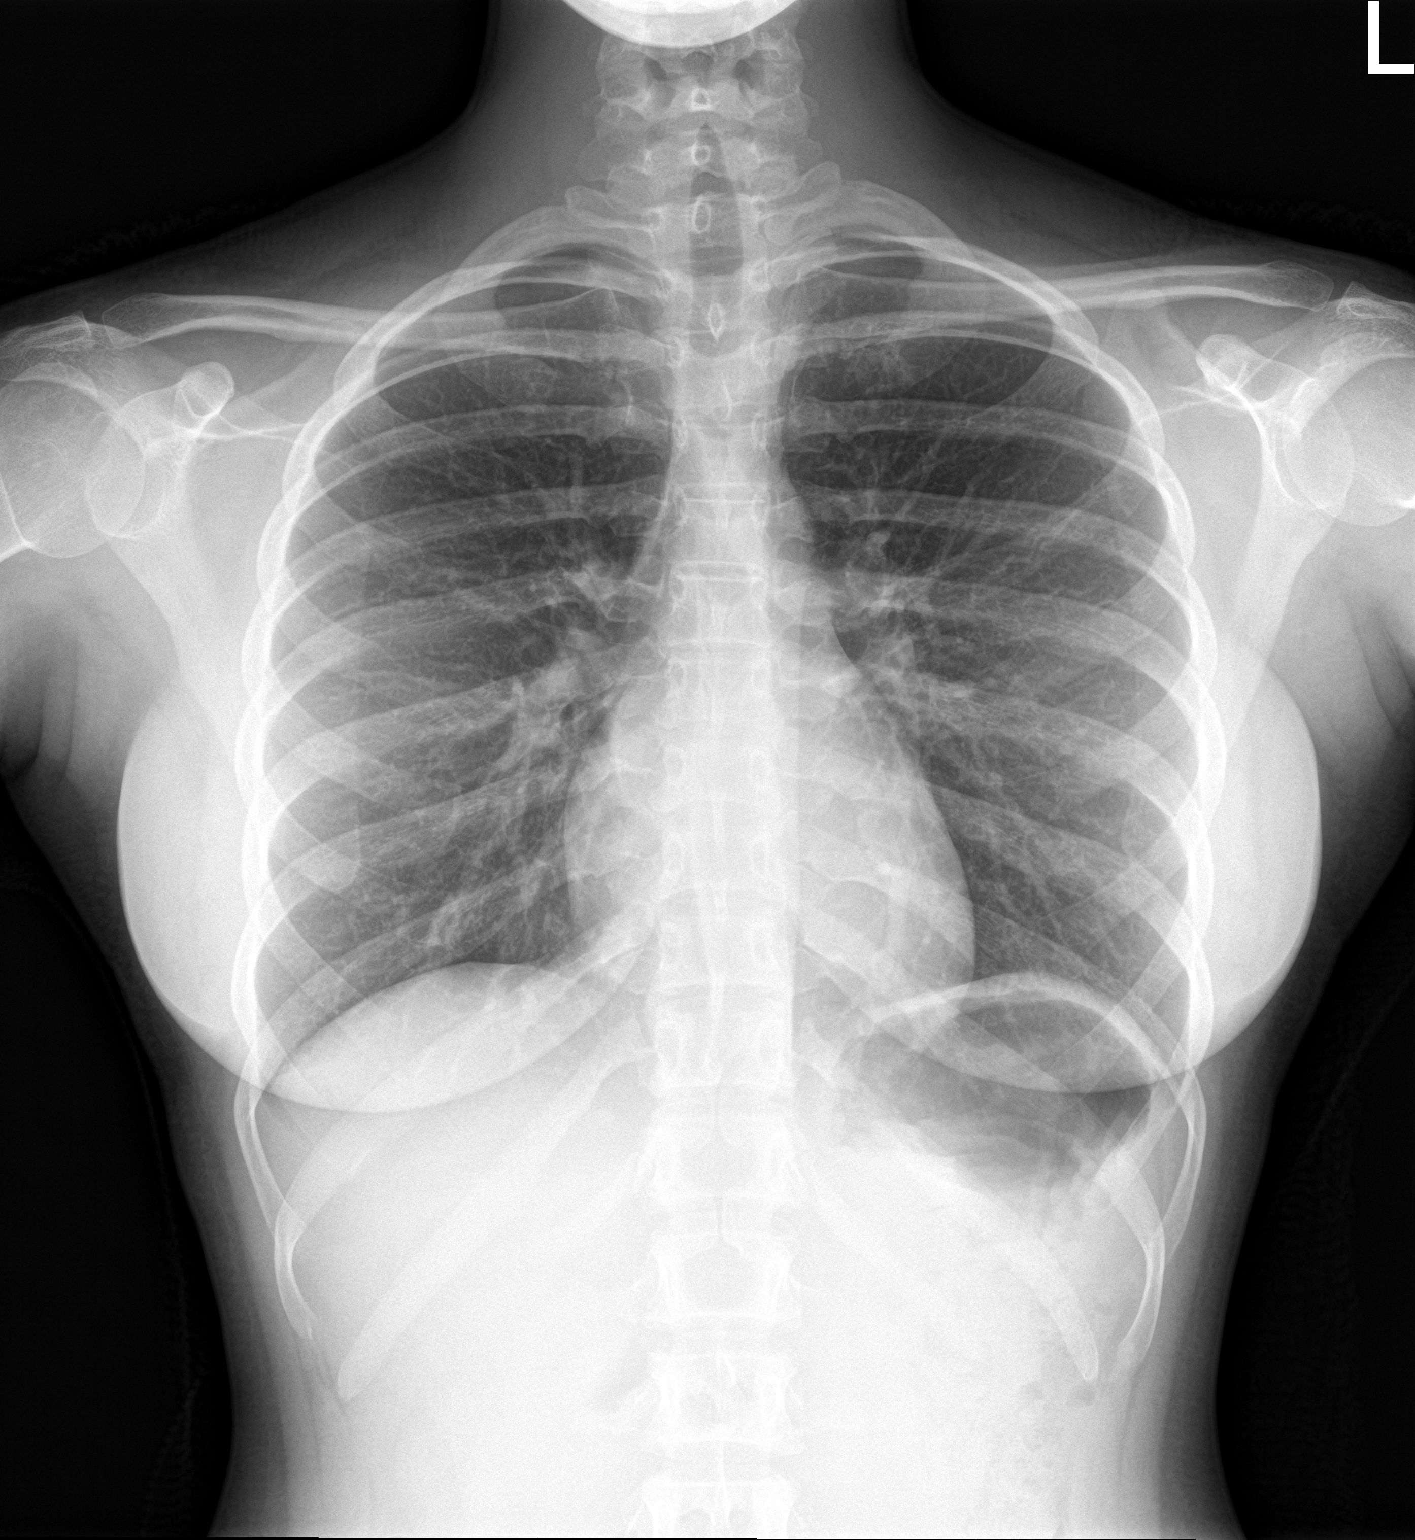

[chest lat]
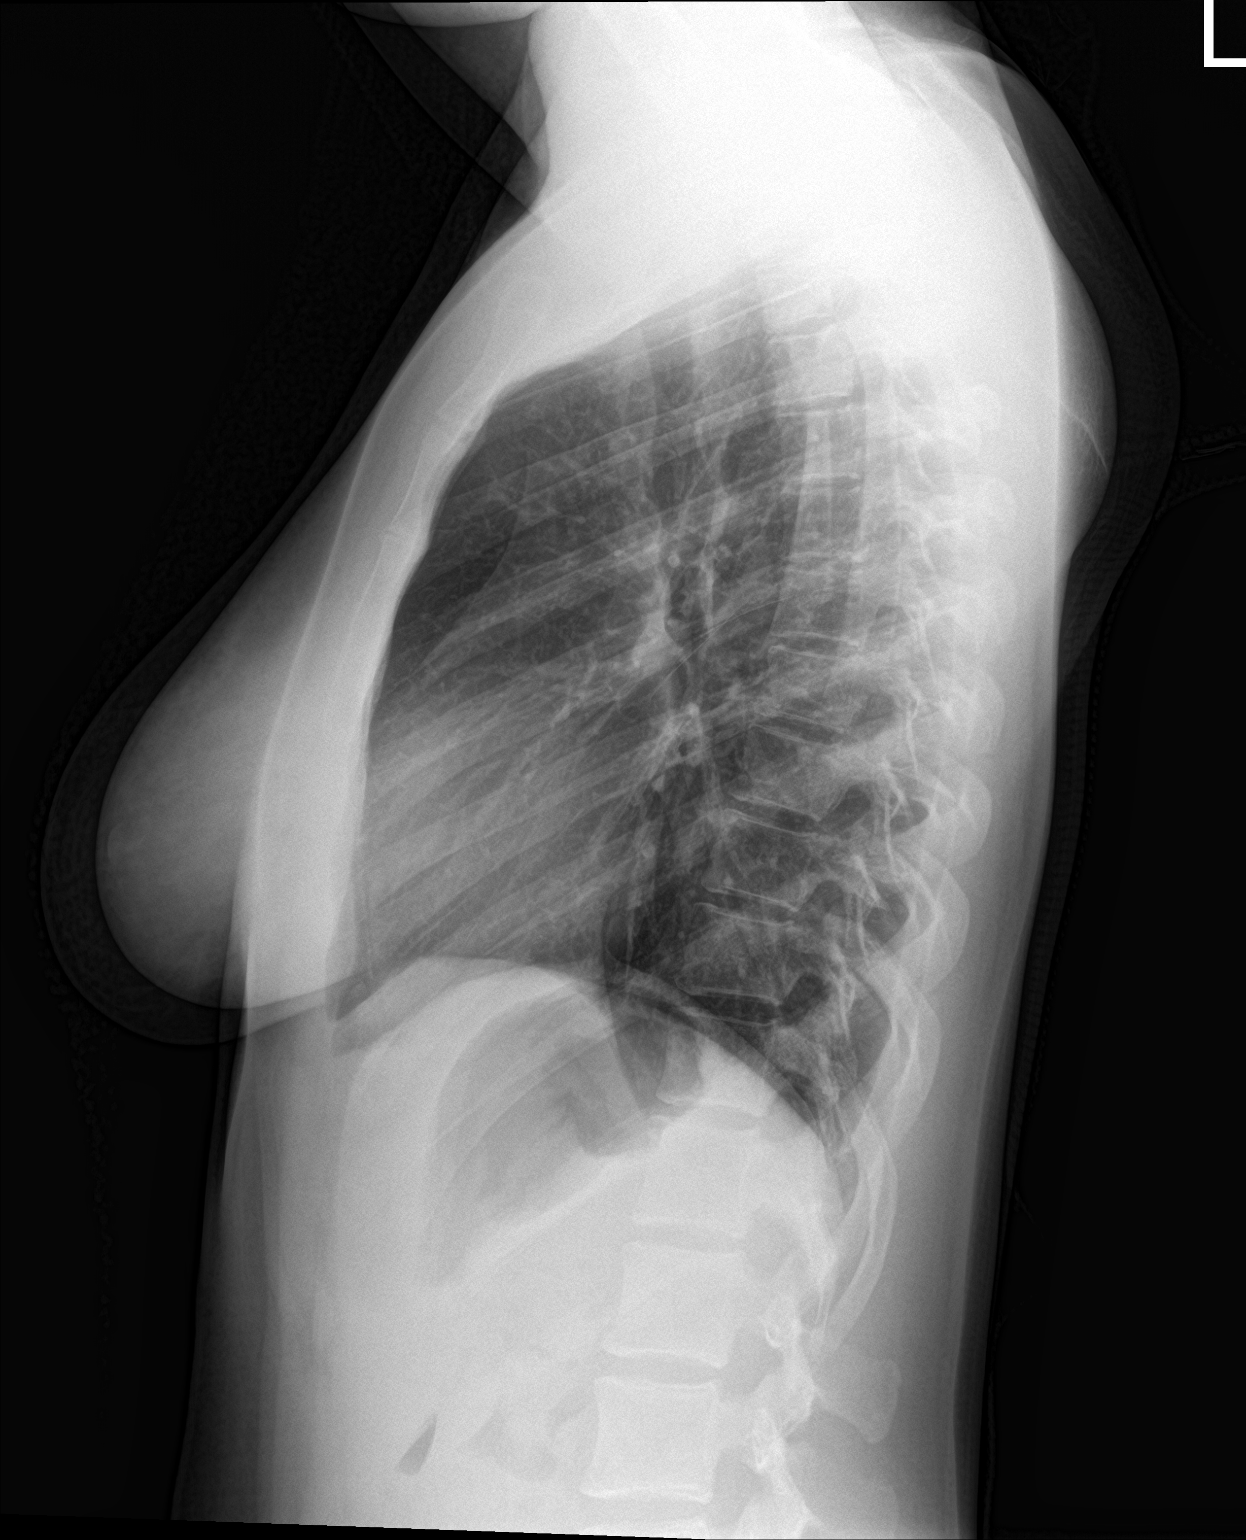

[2 of 2 positions shown; findings below may reference images not displayed]

FINDINGS: Lungs are clear. Heart size and pulmonary vascularity are normal. No
adenopathy. No bone lesions.
IMPRESSION: No edema or consolidation.
